# Patient Record
Sex: Male | Born: 1955 | Race: White | Hispanic: No | Marital: Married | Smoking: Never smoker
Health system: Southern US, Community
[De-identification: ages and names within clinical notes are randomized; demographics above are authoritative.]

## PROBLEM LIST (undated history)

## (undated) DIAGNOSIS — J869 Pyothorax without fistula: Secondary | ICD-10-CM

## (undated) DIAGNOSIS — M1990 Unspecified osteoarthritis, unspecified site: Secondary | ICD-10-CM

## (undated) HISTORY — DX: Pyothorax without fistula: J86.9

## (undated) HISTORY — DX: Unspecified osteoarthritis, unspecified site: M19.90

## (undated) HISTORY — PX: SPINAL FUSION: SHX223

---

## 2002-08-25 HISTORY — PX: LAPAROSCOPIC GASTRIC BANDING: SHX1100

## 2006-01-23 ENCOUNTER — Encounter: Admission: RE | Admit: 2006-01-23 | Discharge: 2006-04-23 | Payer: Self-pay | Admitting: *Deleted

## 2006-07-23 ENCOUNTER — Encounter: Admission: RE | Admit: 2006-07-23 | Discharge: 2006-07-23 | Payer: Self-pay | Admitting: *Deleted

## 2007-11-13 ENCOUNTER — Encounter: Admission: RE | Admit: 2007-11-13 | Discharge: 2007-11-13 | Payer: Self-pay | Admitting: *Deleted

## 2009-01-25 ENCOUNTER — Ambulatory Visit (HOSPITAL_BASED_OUTPATIENT_CLINIC_OR_DEPARTMENT_OTHER): Admission: RE | Admit: 2009-01-25 | Discharge: 2009-01-25 | Payer: Self-pay | Admitting: *Deleted

## 2009-01-25 ENCOUNTER — Encounter (INDEPENDENT_AMBULATORY_CARE_PROVIDER_SITE_OTHER): Payer: Self-pay | Admitting: *Deleted

## 2011-04-11 LAB — POCT HEMOGLOBIN-HEMACUE: Hemoglobin: 14.3 g/dL (ref 13.0–17.0)

## 2011-05-09 NOTE — Op Note (Signed)
NAME:  Edwin [place], Edwin [place]                ACCOUNT NO.:  [id number]   MEDICAL RECORD NO.:  [id number]          PATIENT TYPE:  AMB   LOCATION:  [facility]                         FACILITY:  [facility]   PHYSICIAN:  Alfonse Ras, MD   DATE OF BIRTH:  Oct 04, 1956   DATE OF PROCEDURE:  DATE OF DISCHARGE:                               OPERATIVE REPORT   PREOPERATIVE DIAGNOSIS:  Subcutaneous left upper back mass,  approximately 25 cm.   POSTOPERATIVE DIAGNOSIS:  Subcutaneous left upper back mass,  approximately 25 cm.   PROCEDURE:  Excision of left upper back mass and placement of 19 Blake  drain.   ANESTHESIA:  General.   DESCRIPTION:  After informed consent was granted with the patient, he  was taken to the operating room at [facility] and given  general anesthesia.  He was then placed in the prone position.  The left  upper back was prepped and draped in normal sterile fashion.  Using a  vertical incision overlying the mass, I dissected down through  subcutaneous tissue onto a fairly well encapsulated multilobulated fatty  mass which was excised off the fascia.  This was done with Bovie  electrocautery.  Adequate hemostasis was ensured.  A 19 Blake drain was  placed in the super fascial position.  Subcutaneous closure was  accomplished with a running 2-0 Vicryl.  Skin was closed with staples.  Drain was sutured in place.  Sterile dressing was applied, after 0.5  Marcaine was injected into all tissues.  The patient tolerated the  procedure well, went to PACU in good condition.      Alfonse Ras, MD  Electronically Signed     KRE/MEDQ  D:  01/25/2009  T:  01/25/2009  Job:  [id number]

## 2011-05-26 HISTORY — PX: SHOULDER SURGERY: SHX246

## 2011-06-25 HISTORY — PX: WRIST SURGERY: SHX841

## 2013-08-13 ENCOUNTER — Encounter (INDEPENDENT_AMBULATORY_CARE_PROVIDER_SITE_OTHER): Payer: Federal, State, Local not specified - PPO | Admitting: General Surgery

## 2013-08-29 ENCOUNTER — Ambulatory Visit (INDEPENDENT_AMBULATORY_CARE_PROVIDER_SITE_OTHER): Payer: Medicare Other | Admitting: General Surgery

## 2013-08-29 ENCOUNTER — Encounter (INDEPENDENT_AMBULATORY_CARE_PROVIDER_SITE_OTHER): Payer: Self-pay | Admitting: General Surgery

## 2013-08-29 VITALS — BP 152/74 | HR 94 | Temp 97.1°F | Ht 67.0 in | Wt 225.4 lb

## 2013-08-29 DIAGNOSIS — Z9884 Bariatric surgery status: Secondary | ICD-10-CM

## 2013-08-29 DIAGNOSIS — K219 Gastro-esophageal reflux disease without esophagitis: Secondary | ICD-10-CM

## 2013-08-29 MED ORDER — ESOMEPRAZOLE MAGNESIUM 40 MG PO PACK: 40.0000 mg | PACK | Freq: Every day | ORAL | Status: DC

## 2013-08-29 NOTE — Patient Instructions (Signed)
Try elevating head of bed on 4-5 blocks.

## 2013-08-29 NOTE — Progress Notes (Signed)
Chief complaint: Followup lap band, reflux  History: Patient returns to the office with a history of lap band placement in [place] in 2003. Last seen here 2011. His main problem is nighttime heartburn. He states that he has significant esophageal burning on nightly basis. Prilosec did not help. He is not currently on any acid reduction therapy. He tends to eat a lot of ice cream at night in an effort to relieve the burning. During the day however he he has appropriate restriction and no regurgitation. Weight has been very stable. No major health issues since I saw him last 3 years ago although he is anticipating left hip replacement.   Past Medical History  Diagnosis Date  . Empyema of lung   . Arthritis    Past Surgical History  Procedure Laterality Date  . Spinal fusion  04/1974 and 03/2002  . Laparoscopic gastric banding  08/2002  . Shoulder surgery Right 05/2011  . Wrist surgery Right 06/2011   Current Outpatient Prescriptions  Medication Sig Dispense Refill  . citalopram (CELEXA) 20 MG tablet Take 20 mg by mouth daily.      . fentaNYL (DURAGESIC - DOSED MCG/HR) 100 MCG/HR       . oxyCODONE-acetaminophen (PERCOCET) 10-325 MG per tablet       . simvastatin (ZOCOR) 80 MG tablet Take 80 mg by mouth at bedtime.      Marland Kitchen zolpidem (AMBIEN) 10 MG tablet        No current facility-administered medications for this visit.   No Known Allergies  Exam: BP 152/74  Pulse 94  Temp(Src) 97.1 F (36.2 C) (Temporal)  Ht 5\' 7"  (1.702 m)  Wt 225 lb 6.4 oz (102.241 kg)  BMI 35.29 kg/m2  SpO2 97% Total weight loss 143 pounds since surgery, up 4 pounds since last visit 3 years ago. General: Appears well Lungs: Some decreased breath sounds right base Abdomen: Soft and nontender. With straining there may be a tiny hernia just above his port site.  Assessment and plan: Status post lap band placement of 2003 with sustained excellent weight loss. He however he has persisted nighttime heartburn. No other  symptoms to suggest severe restriction. I would however like to go ahead and image his band barium swallow. We'll start him on Nexium in the meantime. with the results of his imaging. We will see him back no later than 6 months.

## 2013-09-08 ENCOUNTER — Other Ambulatory Visit: Payer: Self-pay

## 2014-03-20 ENCOUNTER — Encounter (INDEPENDENT_AMBULATORY_CARE_PROVIDER_SITE_OTHER): Payer: Medicare Other | Admitting: General Surgery

## 2014-05-01 ENCOUNTER — Encounter (INDEPENDENT_AMBULATORY_CARE_PROVIDER_SITE_OTHER): Payer: Self-pay | Admitting: General Surgery

## 2014-05-01 ENCOUNTER — Ambulatory Visit (INDEPENDENT_AMBULATORY_CARE_PROVIDER_SITE_OTHER): Payer: Medicare Other | Admitting: General Surgery

## 2014-05-01 VITALS — BP 148/82 | HR 96 | Temp 98.2°F | Resp 16 | Ht 67.0 in | Wt 237.0 lb

## 2014-05-01 DIAGNOSIS — Z9884 Bariatric surgery status: Secondary | ICD-10-CM

## 2014-05-01 NOTE — Progress Notes (Signed)
Chief complaint: Followup lap band  History: Patient returns for followup of his LAP-BAND placed in [place] in 2003 with excellent maintain weight loss. I last saw him in September of this past year with new onset a significant nighttime reflux symptoms. We started him on Nexium and he states that within a month or reflux was entirely gone and he stopped the Nexium and has had no recurrent reflux. We have scheduled him for a barium swallow but due to problems with his hip he did not follow through. He states that now he is filling up quickly with a very small medial appropriately with no reflux or vomiting symptoms. He has had a lot of problems with his left hip. He had replacement and then apparently developed a fracture at the distal femoral site and had to have redo surgery. He is about 2 months out from this and just beginning to put weight on his extremity. This has of course limited his physical activity and before he was very active, jogging etc. So he has on about 10 pounds which appears really related to lack of exercise versus any sort of increased intake.  Exam: BP 148/82  Pulse 96  Temp(Src) 98.2 F (36.8 C)  Resp 16  Ht 5\' 7"  (1.702 m)  Wt 237 lb (107.502 kg)  BMI 37.11 kg/m2 Weight loss 131 pounds, a 12 pounds from last visit General: Appears well Abdomen: Soft and nontender. Port site looks fine.  Assessment and plan: It appears he is doing well with his lap band without side effects and appropriate restriction. Some weight gain likely due to lack of exercise. We discussed possible exercise tread as is he could do well sitting such as repetitive lightweight lifting or elastic bands. He will try to add this. He hopefully we'll be up on his feet and be able to walk and begin to burn calories soon. Return in one year or sooner if needed.

## 2014-12-02 ENCOUNTER — Ambulatory Visit (INDEPENDENT_AMBULATORY_CARE_PROVIDER_SITE_OTHER): Payer: Medicare Other

## 2014-12-02 VITALS — BP 132/78 | HR 74 | Resp 16

## 2014-12-02 DIAGNOSIS — M79672 Pain in left foot: Secondary | ICD-10-CM

## 2014-12-02 DIAGNOSIS — M19072 Primary osteoarthritis, left ankle and foot: Secondary | ICD-10-CM

## 2014-12-02 DIAGNOSIS — M898X9 Other specified disorders of bone, unspecified site: Secondary | ICD-10-CM

## 2014-12-02 NOTE — Patient Instructions (Signed)
Pre-Operative Instructions  Congratulations, you have decided to take an important step to improving your quality of life.  You can be assured that the doctors of [facility] will be with you every step of the way.  1. Plan to be at the surgery center/hospital at least 1 (one) hour prior to your scheduled time unless otherwise directed by the surgical center/hospital staff.  You must have a responsible adult accompany you, remain during the surgery and drive you home.  Make sure you have directions to the surgical center/hospital and know how to get there on time. 2. For hospital based surgery you will need to obtain a history and physical form from your family physician within 1 month prior to the date of surgery- we will give you a form for you primary physician.  3. We make every effort to accommodate the date you request for surgery.  There are however, times where surgery dates or times have to be moved.  We will contact you as soon as possible if a change in schedule is required.   4. No Aspirin/Ibuprofen for one week before surgery.  If you are on aspirin, any non-steroidal anti-inflammatory medications (Mobic, Aleve, Ibuprofen) you should stop taking it 7 days prior to your surgery.  You make take Tylenol  For pain prior to surgery.  5. Medications- If you are taking daily heart and blood pressure medications, seizure, reflux, allergy, asthma, anxiety, pain or diabetes medications, make sure the surgery center/hospital is aware before the day of surgery so they may notify you which medications to take or avoid the day of surgery. 6. No food or drink after midnight the night before surgery unless directed otherwise by surgical center/hospital staff. 7. No alcoholic beverages 24 hours prior to surgery.  No smoking 24 hours prior to or 24 hours after surgery. 8. Wear loose pants or shorts- loose enough to fit over bandages, boots, and casts. 9. No slip on shoes, sneakers are best. 10. Bring  your boot with you to the surgery center/hospital.  Also bring crutches or a walker if your physician has prescribed it for you.  If you do not have this equipment, it will be provided for you after surgery. 11. If you have not been contracted by the surgery center/hospital by the day before your surgery, call to confirm the date and time of your surgery. 12. Leave-time from work may vary depending on the type of surgery you have.  Appropriate arrangements should be made prior to surgery with your employer. 13. Prescriptions will be provided immediately following surgery by your doctor.  Have these filled as soon as possible after surgery and take the medication as directed. 14. Remove nail polish on the operative foot. 15. Wash the night before surgery.  The night before surgery wash the foot and leg well with the antibacterial soap provided and water paying special attention to beneath the toenails and in between the toes.  Rinse thoroughly with water and dry well with a towel.  Perform this wash unless told not to do so by your physician.  Enclosed: 1 Ice pack (please put in freezer the night before surgery)   1 Hibiclens skin cleaner   Pre-op Instructions  If you have any questions regarding the instructions, do not hesitate to call our office.  [place]: [address] [place], [place] [address] [phone]  [place]: [address] [place], [place] [address] [phone]  [place]: [address]  [place], [place] [address] [phone]  Dr. Chloe Flis   Tuchman DPM, Dr. Norman Regal DPM Dr. Jarold Macomber DPM, Dr. M. Todd Hyatt DPM, Dr. Kathryn Egerton DPM 

## 2014-12-02 NOTE — Progress Notes (Signed)
Subjective:    Patient ID: Edwin [place], male    DOB: May 19, 1956, 58 y.o.   MRN: [id number]  HPI  Pt presents with left foot pain, bone spur/knot on top of left foot that causes pain when ambulating and wearing closed toe shoes  Review of Systems  All other systems reviewed and are negative.      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 His primary in both feet having left painful symptomatic with abnormal sensation for more than a year. Patient is undergoing left knee surgery recently lost some weight previously was over 400 pounds this is likely caused some arthrosis of both feet and there is significant spurring of the tarsometatarsal joint at the second and third met cuneiform articulations in particular on the left foot. X-rays confirm this as do ultrasounds from Dr. Madaline Guthrie office. There is large bony prominence on palpation over the metatarsals or tarsometatarsal junction left foot. Painful and direct compression there is little or no pain over the distal central gym [place] of the digits or on motion of the metatarsals and cells on inversion or eversion compression of the foot mostly tenderness is a direct compression over the dorsum of the exostosis. Patient does have a history of peripheral neuropathy is mild neuritis abnormal sensation in toes at times it is says it was possibly prediabetic state patient was diagnosed with when he was much heavier since that time he used diabetes is been managed with diet and exercise. Extremities follows pedal pulses are palpable DP +2 over 4 PT +2 over 4 bilateral capillary refill time 3 seconds all digits up in clinic and proprioceptive sensations. Be intact and symmetric bilateral knee of some mild paresthesias according to the patient there is no plantar response DTR is elicited hematological he skin  color pigment normal hair growth absent nails somewhat proptotic orthopedic exam rectus foot type month rotatory changes noted and weightbearing there is some arthrosis over the Lisfranc joints second and third met cuneiform regulations in particular with some dorsal spurring and osteophytes noted on lateral projection x-rays. No other cysts noted tumors no other osseous abdomen obese identified. No open wounds or ulcers are noted.       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 This is causing irritation to the neurovascular bundle and likely some neuritis pain due to the bony prominence. Patient is tried conservative care padding cushioning anti-inflammatories all with little or no success per my recommendation is that of Dr. Gershon Mussel and per patient's request surgical history as scheduled. Plan at this time is for tarsal exostectomy for smoothing of tarsometatarsal bone junction in hyperostosis of those bones. Patient understands the risk of complications and alternatives should note that the bone hyperostosis is also medially beneath the dorsalis pedis artery the arteries just slightly medial to the majority of the prominence reprocessed just lateral to the palpable dorsalis pedis. complications of surgery scheduled his continues with appropriate follow-up thereafter there no guarantees is truly for pain patient and her stands he may have some abnormal sensation neuritis that his residual despite excision of the spurring there may be scar tissue over the bony prominence. we be reduced any additional damage will likely be reduced with the procedure. consent was reviewed and signed and surgery scheduled   Maryjean Ka DPM

## 2014-12-30 DIAGNOSIS — M13879 Other specified arthritis, unspecified ankle and foot: Secondary | ICD-10-CM

## 2014-12-30 DIAGNOSIS — M25775 Osteophyte, left foot: Secondary | ICD-10-CM

## 2015-01-05 ENCOUNTER — Ambulatory Visit (INDEPENDENT_AMBULATORY_CARE_PROVIDER_SITE_OTHER): Payer: Medicare Other

## 2015-01-05 VITALS — BP 147/84 | HR 90 | Resp 18

## 2015-01-05 DIAGNOSIS — M898X9 Other specified disorders of bone, unspecified site: Secondary | ICD-10-CM

## 2015-01-05 DIAGNOSIS — Z09 Encounter for follow-up examination after completed treatment for conditions other than malignant neoplasm: Secondary | ICD-10-CM

## 2015-01-05 NOTE — Progress Notes (Signed)
Subjective:    Patient ID: Edwin [place], male    DOB: 07/05/1956, 58 y.o.   MRN: [id number]  HPI my left foot is doing good and I take my medicine before the pain ever gets bad and it looks good    Review of Systems no new findings or systemic changes noted    Objective:   Physical Exam Patient presents this time 6 day status post tarsal is exostectomy over the second met and third met cuneiform articulation sites mild edema and ecchymosis present minimal pain or tenderness or discomfort had done numbness from the regional block for 24-48 hours at this time to minimal pain medications no complaint of pain discomfort incision well coapted no dehiscence no discharge drainage no signs of infection minimal discomfort or pain on palpation. Dry sterile compressive dressing reapplied to the left foot at this time.       Assessment & Plan:  Assessment good postop progress following tarsal exostectomy left foot at this time dressing reapplied reappointed one month for long-term follow-up within 1 week may resume normal bathing and hygiene with in 2-3 weeks returned to comfortable walking shoes  Richard Sikora DPM 

## 2015-01-05 NOTE — Patient Instructions (Signed)
ICE INSTRUCTIONS  Apply ice or cold pack to the affected area at least 3 times a day for 10-15 minutes each time.  You should also use ice after prolonged activity or vigorous exercise.  Do not apply ice longer than 20 minutes at one time.  Always keep a cloth between your skin and the ice pack to prevent burns.  Being consistent and following these instructions will help control your symptoms.  We suggest you purchase a gel ice pack because they are reusable and do bit leak.  Some of them are designed to wrap around the area.  Use the method that works best for you.  Here are some other suggestions for icing.   Use a frozen bag of peas or corn-inexpensive and molds well to your body, usually stays frozen for 10 to 20 minutes.  Wet a towel with cold water and squeeze out the excess until it's damp.  Place in a bag in the freezer for 20 minutes. Then remove and use.  Maintain bandage for one more week. Starting next week maintain removed bandage and resume normal bathing and showering. Retained cocoa butter or Neosporin to the incision area to help with the scar. Maintain compression stocking that was given to keep down and swelling or scarring. Next para Lind GuestGraff within 2 weeks may resume come for walking tennis or athletic shoe as tolerated. Next  Swelling and soreness can last they were from 1-3 months post surgery contact if any changes or exacerbations occur in the interim reappointed in one month for follow-up

## 2015-01-12 NOTE — Progress Notes (Signed)
DOS 12/30/2014 Left Tarsal exostectomy.

## 2015-02-02 ENCOUNTER — Ambulatory Visit (INDEPENDENT_AMBULATORY_CARE_PROVIDER_SITE_OTHER): Payer: Medicare Other

## 2015-02-02 DIAGNOSIS — M19072 Primary osteoarthritis, left ankle and foot: Secondary | ICD-10-CM

## 2015-02-02 DIAGNOSIS — M898X9 Other specified disorders of bone, unspecified site: Secondary | ICD-10-CM

## 2015-02-02 DIAGNOSIS — Z09 Encounter for follow-up examination after completed treatment for conditions other than malignant neoplasm: Secondary | ICD-10-CM

## 2015-02-02 NOTE — Progress Notes (Signed)
   Subjective:    Patient ID: Edwin ScaleJoseph V [place], male    DOB: 27-Nov-1956, 59 y.o.   MRN: [id number]  HPI  LT FOOT IS DOING MUCH BETTER.''  Review of Systems no new findings or systemic changes noted    Objective:   Physical Exam Neurovascular status is intact pedal pulses are palpable incision well coapted patient is proxy 1 month status post exostectomy dorsal Lisfranc's tarsometatarsal joint left foot. Suture tacks are still present will be removed at this time however no pain on palpation range of motion when he tries to walk quickly sometimes feels a little some tenderness feels much no paresthesia no distal radiation of pain no sharp pain on direct compression or palpation. X-rays reveal absence of dorsal spur exostosis and tarsometatarsal junction on lateral projection.       Assessment & Plan:  Assessment good postop progress suggest two-month long-term postop follow-up maintain appropriate accommodative shoes keep the area to a cushioned had or constrictive over the instep and avoid any injury trauma to the area during the healing process hopefully 2 months long-term postop follow-up will likely need last the visit. Patient overdoes indicate is having some issues early issues with the same problem on the contralateral right foot may address these were future if needed next  Alvan Dameichard Cathyann Kilfoyle DPM

## 2015-04-06 ENCOUNTER — Ambulatory Visit: Payer: Medicare Other

## 2015-04-06 ENCOUNTER — Ambulatory Visit (INDEPENDENT_AMBULATORY_CARE_PROVIDER_SITE_OTHER): Payer: Medicare Other | Admitting: Podiatry

## 2015-04-06 ENCOUNTER — Ambulatory Visit (INDEPENDENT_AMBULATORY_CARE_PROVIDER_SITE_OTHER): Payer: Medicare Other

## 2015-04-06 ENCOUNTER — Encounter: Payer: Self-pay | Admitting: Podiatry

## 2015-04-06 VITALS — BP 154/80 | HR 81 | Resp 13

## 2015-04-06 DIAGNOSIS — M898X9 Other specified disorders of bone, unspecified site: Secondary | ICD-10-CM

## 2015-04-06 DIAGNOSIS — Z9889 Other specified postprocedural states: Secondary | ICD-10-CM

## 2015-04-06 DIAGNOSIS — M779 Enthesopathy, unspecified: Secondary | ICD-10-CM

## 2015-04-06 MED ORDER — TRIAMCINOLONE ACETONIDE 10 MG/ML IJ SUSP: 10.0000 mg | Freq: Once | INTRAMUSCULAR | Status: AC

## 2015-04-07 NOTE — Progress Notes (Signed)
Subjective:     Patient ID: Edwin [place], male   DOB: 12-12-1956, 59 y.o.   MRN: [id number]  HPI patient states the top of his left foot is doing well and he wants to know about the right foot which has been sore and making it difficult to wear certain types of shoes. It is not as enlarged as the left but is tender   Review of Systems     Objective:   Physical Exam Neurovascular status intact with muscle strength adequate range of motion within normal limits. Patient's noted to have quite a bit of discomfort in the dorsal of the right foot with inflammation and fluid buildup around the tendon complex but no indications of severe prominence and well-healed surgical site left dorsal foot    Assessment:     Tendinitis of the right dorsal foot with minimal spur formation and well-healed surgical site left dorsal foot    Plan:     X-rayed both feet and reviewed with patient and today we are discharging from the left foot and for the right foot I went ahead and injected the dorsal complex re-milligrams Kenalog 5 mg Xylocaine and advised on physical therapy and wider-type shoes

## 2015-10-07 ENCOUNTER — Ambulatory Visit (INDEPENDENT_AMBULATORY_CARE_PROVIDER_SITE_OTHER): Payer: Medicare Other

## 2015-10-07 ENCOUNTER — Ambulatory Visit (INDEPENDENT_AMBULATORY_CARE_PROVIDER_SITE_OTHER): Payer: Medicare Other | Admitting: Podiatry

## 2015-10-07 ENCOUNTER — Encounter: Payer: Self-pay | Admitting: Podiatry

## 2015-10-07 DIAGNOSIS — S93601A Unspecified sprain of right foot, initial encounter: Secondary | ICD-10-CM

## 2015-10-07 DIAGNOSIS — M898X9 Other specified disorders of bone, unspecified site: Secondary | ICD-10-CM

## 2015-10-07 DIAGNOSIS — M19072 Primary osteoarthritis, left ankle and foot: Secondary | ICD-10-CM | POA: Diagnosis not present

## 2015-10-07 DIAGNOSIS — Z9889 Other specified postprocedural states: Secondary | ICD-10-CM

## 2015-10-07 NOTE — Patient Instructions (Signed)
Pre-Operative Instructions  Congratulations, you have decided to take an important step to improving your quality of life.  You can be assured that the doctors of [facility] will be with you every step of the way.  1. Plan to be at the surgery center/hospital at least 1 (one) hour prior to your scheduled time unless otherwise directed by the surgical center/hospital staff.  You must have a responsible adult accompany you, remain during the surgery and drive you home.  Make sure you have directions to the surgical center/hospital and know how to get there on time. 2. For hospital based surgery you will need to obtain a history and physical form from your family physician within 1 month prior to the date of surgery- we will give you a form for you primary physician.  3. We make every effort to accommodate the date you request for surgery.  There are however, times where surgery dates or times have to be moved.  We will contact you as soon as possible if a change in schedule is required.   4. No Aspirin/Ibuprofen for one week before surgery.  If you are on aspirin, any non-steroidal anti-inflammatory medications (Mobic, Aleve, Ibuprofen) you should stop taking it 7 days prior to your surgery.  You make take Tylenol  For pain prior to surgery.  5. Medications- If you are taking daily heart and blood pressure medications, seizure, reflux, allergy, asthma, anxiety, pain or diabetes medications, make sure the surgery center/hospital is aware before the day of surgery so they may notify you which medications to take or avoid the day of surgery. 6. No food or drink after midnight the night before surgery unless directed otherwise by surgical center/hospital staff. 7. No alcoholic beverages 24 hours prior to surgery.  No smoking 24 hours prior to or 24 hours after surgery. 8. Wear loose pants or shorts- loose enough to fit over bandages, boots, and casts. 9. No slip on shoes, sneakers are best. 10. Bring  your boot with you to the surgery center/hospital.  Also bring crutches or a walker if your physician has prescribed it for you.  If you do not have this equipment, it will be provided for you after surgery. 11. If you have not been contracted by the surgery center/hospital by the day before your surgery, call to confirm the date and time of your surgery. 12. Leave-time from work may vary depending on the type of surgery you have.  Appropriate arrangements should be made prior to surgery with your employer. 13. Prescriptions will be provided immediately following surgery by your doctor.  Have these filled as soon as possible after surgery and take the medication as directed. 14. Remove nail polish on the operative foot. 15. Wash the night before surgery.  The night before surgery wash the foot and leg well with the antibacterial soap provided and water paying special attention to beneath the toenails and in between the toes.  Rinse thoroughly with water and dry well with a towel.  Perform this wash unless told not to do so by your physician.  Enclosed: 1 Ice pack (please put in freezer the night before surgery)   1 Hibiclens skin cleaner   Pre-op Instructions  If you have any questions regarding the instructions, do not hesitate to call our office.  [place]: [address] [place], [place] [address] [phone]  [place]: [address] [place], [place] [address] [phone]  [place]: [address]  [place], [place] [address] [phone]  Dr. Richard   Tuchman DPM, Dr. Norman Regal DPM Dr. Richard Sikora DPM, Dr. M. Todd Hyatt DPM, Dr. Kathryn Egerton DPM 

## 2015-10-08 NOTE — Progress Notes (Signed)
Subjective:     Patient ID: [place], male   DOB: 12/10/1956, 59 y.o.   MRN: [id number]  HPI patient states my left foot is doing well but my right foot is really bothering me on top and making it hard to wear shoe gear comfortably. I've tried to change my shoe gear tried to pad it without relief and I know on getting need to get it corrected   Review of Systems     Objective:   Physical Exam Neurovascular status intact muscle strength adequate with exostotic area on the dorsal aspect of the right midtarsal joint with inflammation and pain upon pressure and palpation    Assessment:     Bony lesion dorsal right foot with pain noted    Plan:     Reviewed condition and discussed this with patient and at this time due to long-standing nature of problem and failure to respond to conservative care it's been recommended that tarsal exostectomy be obtained due to health well the left one did. I allowed patient to read consent form reviewing all possible complications and alternative treatments and he is comfortable with this and wants surgery and signs consent form. Scheduled for outpatient surgery and encouraged to call with questions

## 2015-10-12 DIAGNOSIS — M2570 Osteophyte, unspecified joint: Secondary | ICD-10-CM

## 2015-10-20 ENCOUNTER — Encounter: Payer: Self-pay | Admitting: Podiatry

## 2015-10-20 ENCOUNTER — Ambulatory Visit (INDEPENDENT_AMBULATORY_CARE_PROVIDER_SITE_OTHER): Payer: Medicare Other

## 2015-10-20 ENCOUNTER — Ambulatory Visit (INDEPENDENT_AMBULATORY_CARE_PROVIDER_SITE_OTHER): Payer: Medicare Other | Admitting: Podiatry

## 2015-10-20 VITALS — BP 149/72 | HR 81 | Resp 16

## 2015-10-20 DIAGNOSIS — M898X9 Other specified disorders of bone, unspecified site: Secondary | ICD-10-CM

## 2015-10-20 DIAGNOSIS — Z9889 Other specified postprocedural states: Secondary | ICD-10-CM

## 2015-10-20 NOTE — Progress Notes (Signed)
Subjective:     Patient ID: Edwin Franklin, male   DOB: [birth year], 59 y.o.   MRN: [id number]  HPI patient presents not following my advice and wearing a tennis shoe one week after surgery. He did take his own dressing off Review of Systems     Objective:   Physical Exam Neurovascular status intact negative Homans sign noted with well coapted incision site right that has a mild increase in edema secondary to patient noncompliance    Assessment:     Doing okay despite patient noncompliance right    Plan:     Explained importance of wearing surgical shoe and dispensed Ace wrap to compress the area and explained continued elevation and reappoint in approximately 10 days for suture removal

## 2015-10-21 ENCOUNTER — Encounter: Payer: Self-pay | Admitting: Podiatry

## 2015-10-22 ENCOUNTER — Other Ambulatory Visit: Payer: Self-pay

## 2015-11-01 ENCOUNTER — Encounter: Payer: Self-pay | Admitting: Podiatry

## 2015-11-01 ENCOUNTER — Ambulatory Visit: Payer: Medicare Other

## 2015-11-01 ENCOUNTER — Ambulatory Visit (INDEPENDENT_AMBULATORY_CARE_PROVIDER_SITE_OTHER): Payer: Medicare Other

## 2015-11-01 ENCOUNTER — Ambulatory Visit (INDEPENDENT_AMBULATORY_CARE_PROVIDER_SITE_OTHER): Payer: Medicare Other | Admitting: Podiatry

## 2015-11-01 VITALS — BP 141/82 | HR 87 | Resp 16 | Ht 67.0 in | Wt 225.0 lb

## 2015-11-01 DIAGNOSIS — Z9889 Other specified postprocedural states: Secondary | ICD-10-CM

## 2015-11-01 DIAGNOSIS — M898X9 Other specified disorders of bone, unspecified site: Secondary | ICD-10-CM

## 2015-11-01 DIAGNOSIS — M79672 Pain in left foot: Secondary | ICD-10-CM

## 2015-11-03 NOTE — Progress Notes (Signed)
Subjective:     Patient ID: Edwin Franklin, male   DOB: [birth year], 59 y.o.   MRN: [id number]  HPI patient presents with pain in the right dorsal foot but states he continues to improve   Review of Systems     Objective:   Physical Exam  Neurovascular status intact muscle strength adequate with incision site right that's healing well with no drainage noted and mild edema    Assessment:     Healing surgical site dorsal right foot    Plan:     Advised on continued compression slow return to soft shoe gear and patient will be seen back as needed.

## 2015-12-23 ENCOUNTER — Encounter: Payer: Self-pay | Admitting: Podiatry

## 2015-12-23 NOTE — Progress Notes (Signed)
DOS 10-12-15 Tarsal exostectomy right  Rx'd Vicodin 10/325 #30

## 2018-06-04 ENCOUNTER — Other Ambulatory Visit: Payer: Self-pay | Admitting: Surgery

## 2018-06-04 DIAGNOSIS — Z9884 Bariatric surgery status: Secondary | ICD-10-CM

## 2018-06-10 ENCOUNTER — Other Ambulatory Visit: Payer: Self-pay | Admitting: Surgery

## 2018-06-10 ENCOUNTER — Ambulatory Visit
Admission: RE | Admit: 2018-06-10 | Discharge: 2018-06-10 | Disposition: A | Discharge status: Home / Self Care | Payer: Medicare Other | Source: Ambulatory Visit | Attending: Surgery | Admitting: Surgery

## 2018-06-10 DIAGNOSIS — Z9884 Bariatric surgery status: Secondary | ICD-10-CM

## 2018-10-16 ENCOUNTER — Encounter: Payer: Medicare Other | Attending: Surgery | Admitting: Skilled Nursing Facility1

## 2018-10-16 ENCOUNTER — Encounter: Payer: Self-pay | Admitting: Skilled Nursing Facility1

## 2018-10-16 DIAGNOSIS — Z713 Dietary counseling and surveillance: Secondary | ICD-10-CM | POA: Insufficient documentation

## 2018-10-16 DIAGNOSIS — Z6841 Body Mass Index (BMI) 40.0 and over, adult: Secondary | ICD-10-CM | POA: Diagnosis not present

## 2018-10-16 DIAGNOSIS — Z9884 Bariatric surgery status: Secondary | ICD-10-CM | POA: Insufficient documentation

## 2018-10-16 DIAGNOSIS — E669 Obesity, unspecified: Secondary | ICD-10-CM
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
## 2018-10-16 NOTE — Patient Instructions (Signed)
-  Find a mental health professional to work with  -Take advantage of the days you are in less pain using the physical therapy exercises  -Aim to eat every 3-5 hours starting with an hour - hour and a half of waking; only have 1 snack after dinner   -When recognizing in the moment you are going to emotionally eat find something else to do to keeping your mind and hands busy

## 2018-12-16 ENCOUNTER — Ambulatory Visit: Payer: Medicare Other | Admitting: Skilled Nursing Facility1

## 2019-01-09 IMAGING — RF DG UGI W/ KUB
9 series · 14 of 24 positions shown · non-contrast
Comparison: None.

CLINICAL DATA: Laparoscopic adjustable gastric banding 5448. Weight
gain.

EXAM:
UPPER GI SERIES WITH KUB
TECHNIQUE: After obtaining a scout radiograph a routine upper GI series was
performed using thin barium
FLUOROSCOPY TIME:  Fluoroscopy Time:  2 minutes 42 second
Radiation Exposure Index (if provided by the fluoroscopic device):
Number of Acquired Spot Images: 0

[Series 1: one shot · 1 of 1 slices shown (1 of 3)]
[im 1/1]
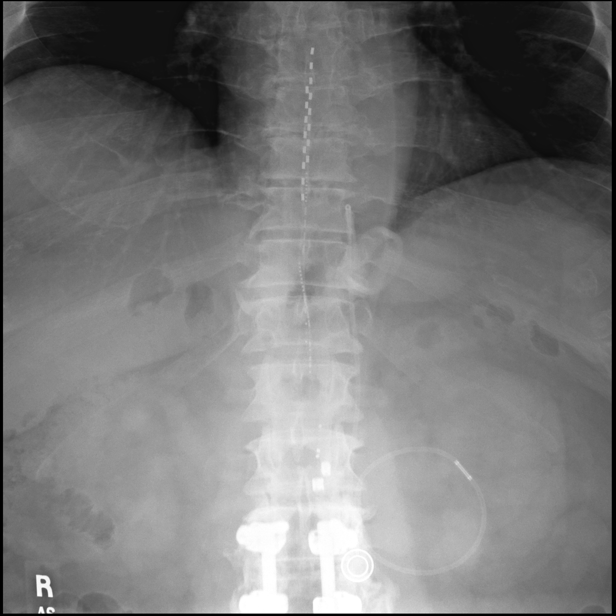

[Series 2: sequence · 1 of 30 frames shown (1 of 6)]
[frame 26/30]
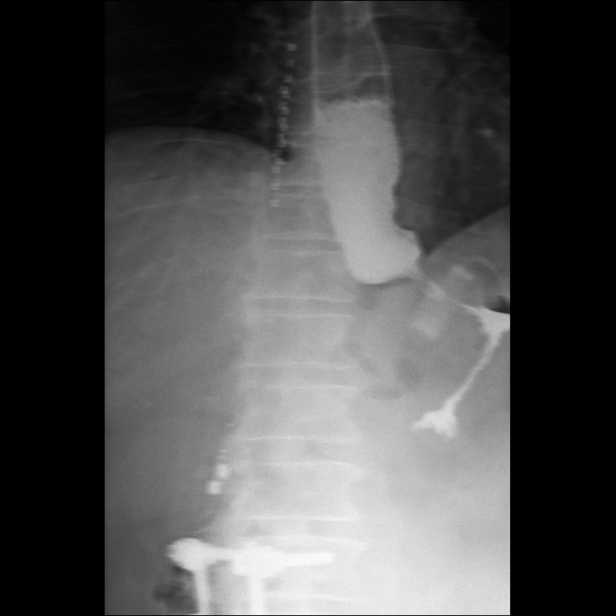

[Series 3: sequence · 2 of 22 frames shown (2 of 6)]
[frame 4/22]
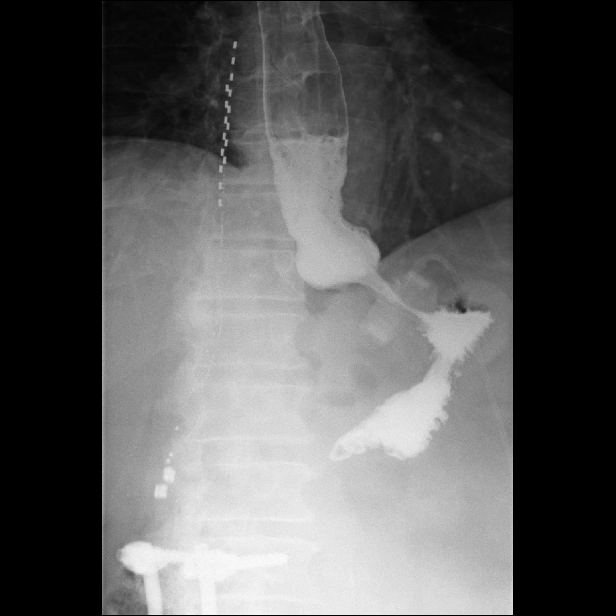
[frame 20/22]
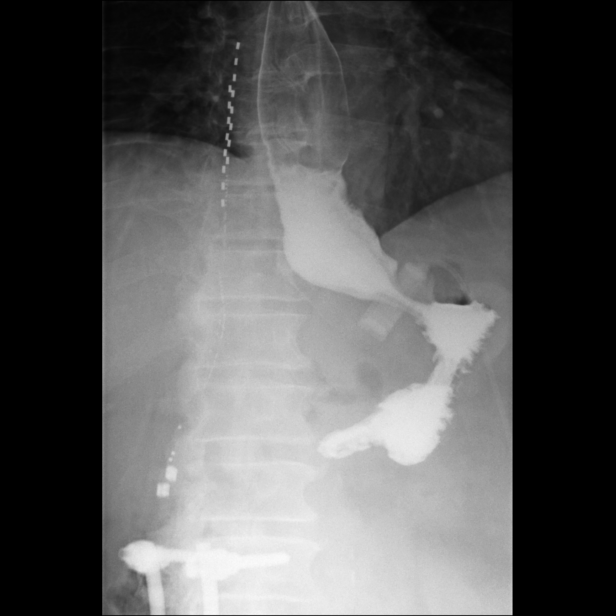

[Series 4: sequence · 1 of 2 frames shown (3 of 6)]
[frame 1/2]
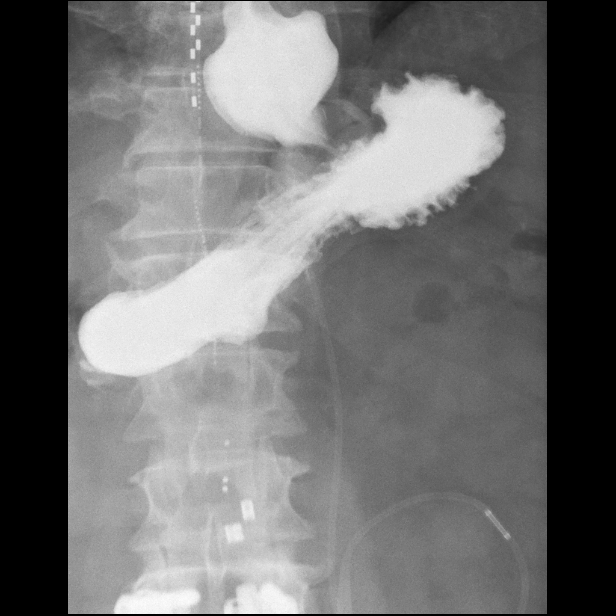

[Series 5: one shot · 1 of 4 slices shown (2 of 3)]
[im 2/4]
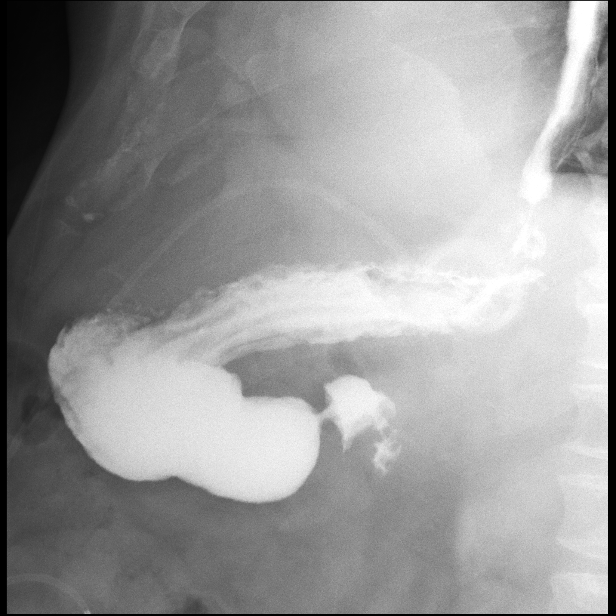

[Series 6: sequence · 2 of 30 frames shown (4 of 6)]
[frame 5/30]
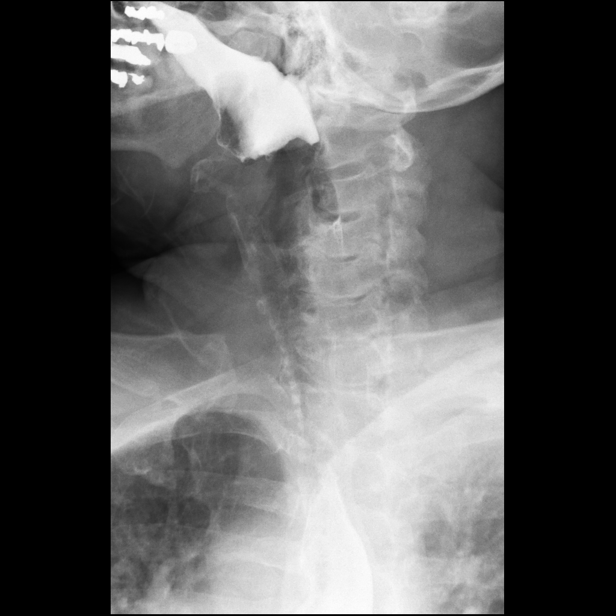
[frame 16/30]
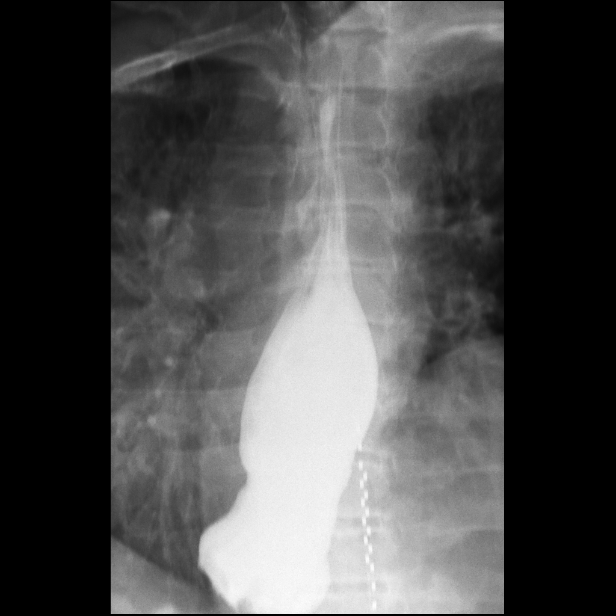

[Series 7: sequence · 2 of 21 frames shown (5 of 6)]
[frame 4/21]
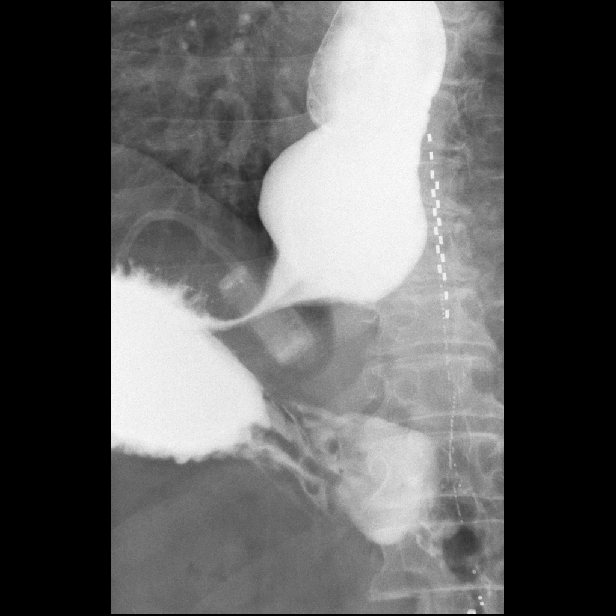
[frame 18/21]
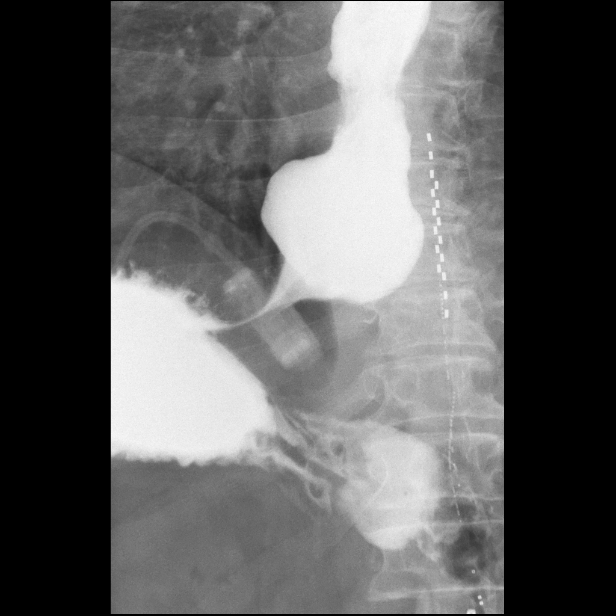

[Series 8: sequence · 2 of 15 frames shown (6 of 6)]
[frame 7/15]
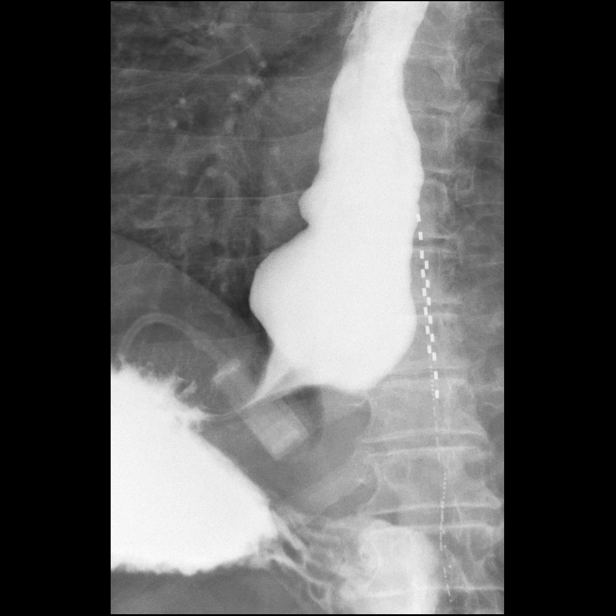
[frame 13/15]
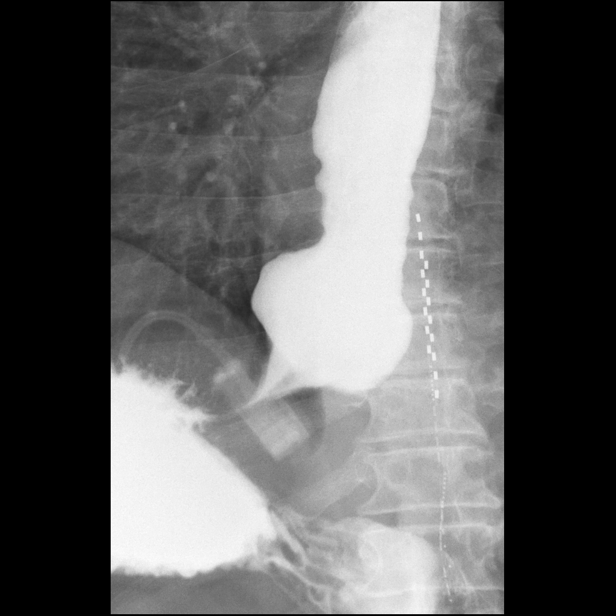

[Series 9: one shot · 2 of 5 slices shown (3 of 3)]
[im 2/5]
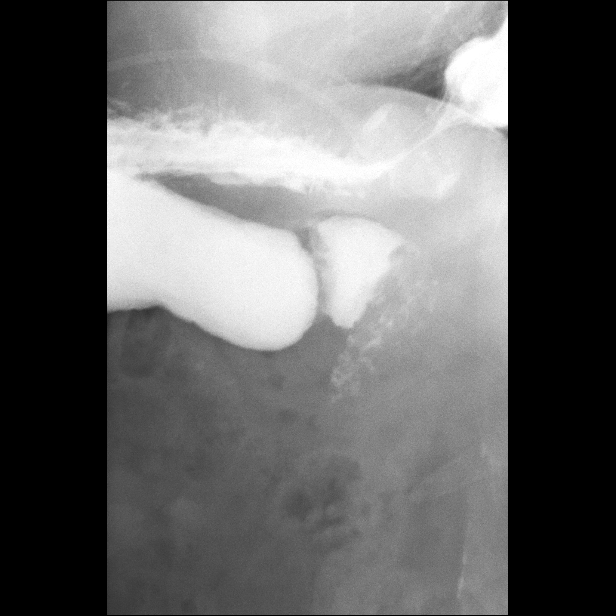
[im 5/5]
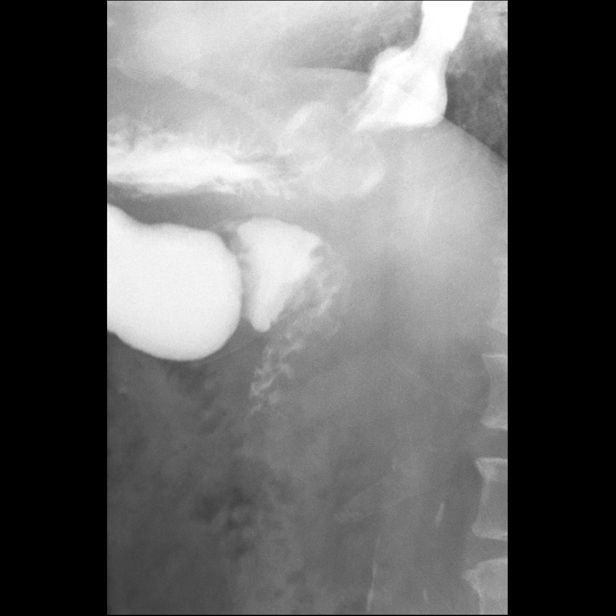

[14 of 24 positions shown; findings below may reference images not displayed]

FINDINGS: Preliminary KUB demonstrates gastric band in satisfactory position.
Lumbar fusion with spinal cord stimulator. Normal bowel gas pattern.

Moderately dilated esophagus with decreased motility. This is due to
tight gastric band. Barium was slow to pass through the gastric band
with narrowed lumen. No esophageal stricture or mass

Stomach and duodenum bulb normal.  No ulcer or mass in the stomach.
IMPRESSION: Tight gastric band. Narrowed lumen through the band with dilated
esophagus. Decreased esophageal motility due to chronic obstruction.
